# Patient Record
Sex: Male | Born: 1960 | Race: Black or African American | Hispanic: No | Smoking: Never smoker
Health system: Southern US, Community
[De-identification: ages and names within clinical notes are randomized; demographics above are authoritative.]

## PROBLEM LIST (undated history)

## (undated) DIAGNOSIS — S161XXA Strain of muscle, fascia and tendon at neck level, initial encounter: Secondary | ICD-10-CM

## (undated) DIAGNOSIS — I10 Essential (primary) hypertension: Secondary | ICD-10-CM

## (undated) DIAGNOSIS — I639 Cerebral infarction, unspecified: Secondary | ICD-10-CM

## (undated) DIAGNOSIS — S39012A Strain of muscle, fascia and tendon of lower back, initial encounter: Secondary | ICD-10-CM

---

## 1999-02-20 ENCOUNTER — Emergency Department (HOSPITAL_COMMUNITY): Admission: EM | Admit: 1999-02-20 | Discharge: 1999-02-20 | Payer: Self-pay | Admitting: Emergency Medicine

## 1999-10-03 ENCOUNTER — Emergency Department (HOSPITAL_COMMUNITY): Admission: EM | Admit: 1999-10-03 | Discharge: 1999-10-03 | Payer: Self-pay | Admitting: Emergency Medicine

## 2006-06-13 ENCOUNTER — Emergency Department (HOSPITAL_COMMUNITY): Admission: EM | Admit: 2006-06-13 | Discharge: 2006-06-13 | Payer: Self-pay | Admitting: Emergency Medicine

## 2006-08-30 ENCOUNTER — Emergency Department (HOSPITAL_COMMUNITY): Admission: EM | Admit: 2006-08-30 | Discharge: 2006-08-30 | Payer: Self-pay | Admitting: Emergency Medicine

## 2007-08-28 ENCOUNTER — Inpatient Hospital Stay (HOSPITAL_COMMUNITY): Admission: EM | Admit: 2007-08-28 | Discharge: 2007-08-31 | Payer: Self-pay | Admitting: Emergency Medicine

## 2007-08-29 ENCOUNTER — Encounter (INDEPENDENT_AMBULATORY_CARE_PROVIDER_SITE_OTHER): Payer: Self-pay | Admitting: Emergency Medicine

## 2007-09-14 ENCOUNTER — Encounter: Admission: RE | Admit: 2007-09-14 | Discharge: 2007-09-30 | Payer: Self-pay | Admitting: Neurology

## 2008-05-23 ENCOUNTER — Emergency Department (HOSPITAL_COMMUNITY): Admission: EM | Admit: 2008-05-23 | Discharge: 2008-05-24 | Payer: Self-pay | Admitting: Emergency Medicine

## 2009-04-27 IMAGING — CT CT HEAD W/O CM
1 series · 16 of 28 positions shown, 20 images · IV contrast (agent unspecified)
Comparison: none

CLINICAL DATA: Code Stroke.   Aphasia.  Now able to talk.
HEAD CT WITHOUT CONTRAST:
TECHNIQUE: Contiguous axial CT images were obtained from the base of the skull through the vertex according to standard protocol without contrast.

[Series 2: brain · axial · 0.47mm/px · z∈[+147,+272]mm · 16 of 28 slices shown, 20 images]
[im 2/28  brain]
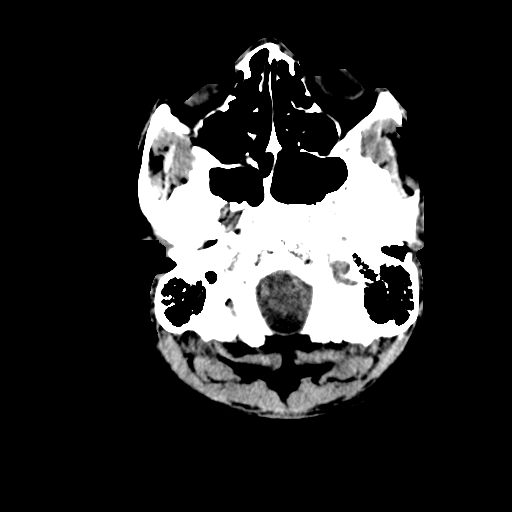
[im 2/28  bone]
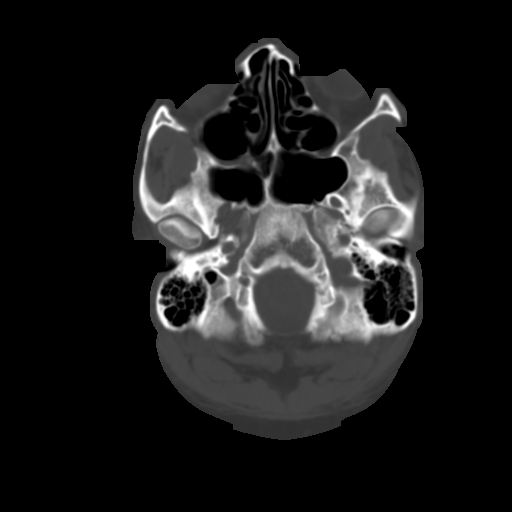
[im 4/28  brain]
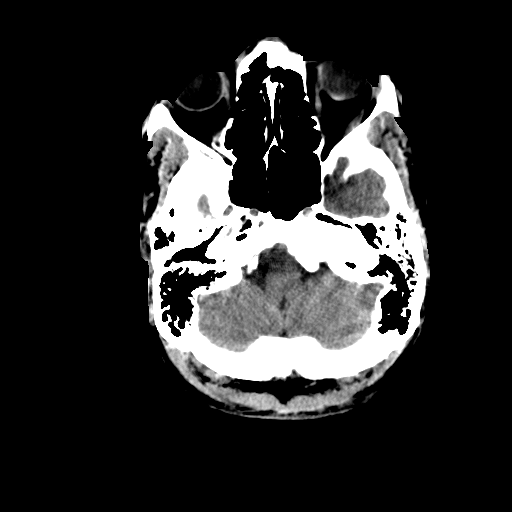
[im 6/28  brain]
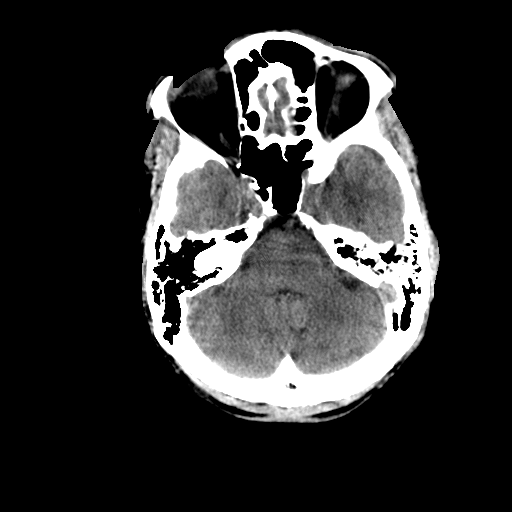
[im 7/28  brain]
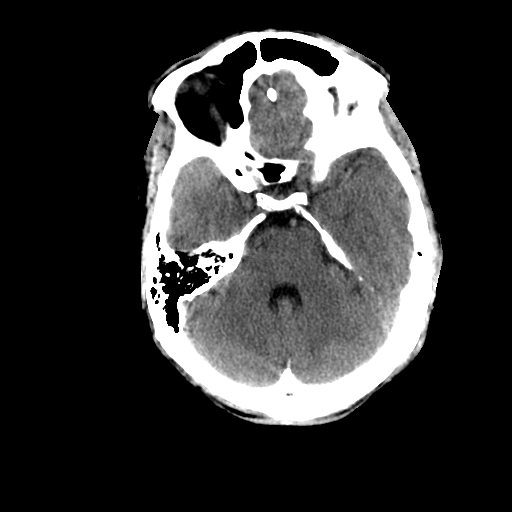
[im 9/28  brain]
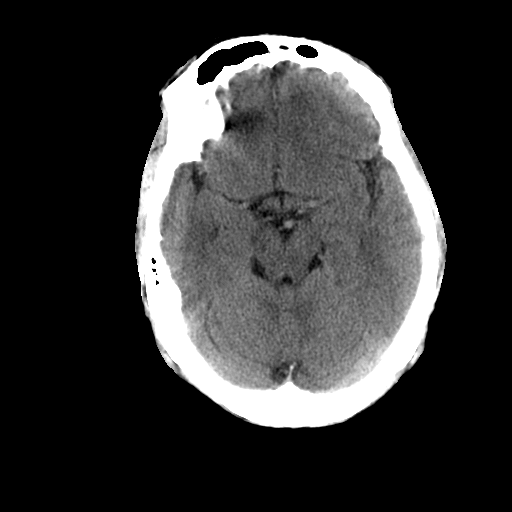
[im 9/28  bone]
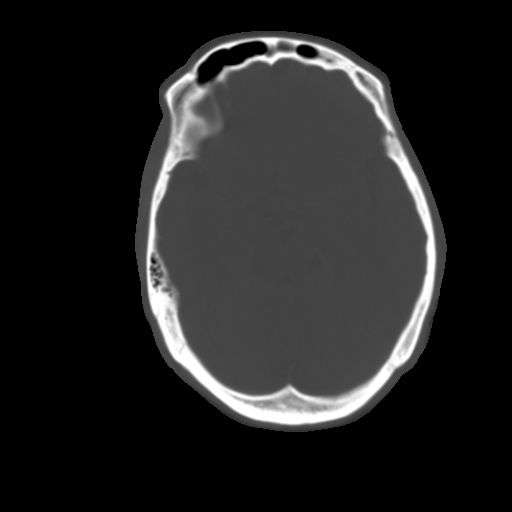
[im 10/28  brain]
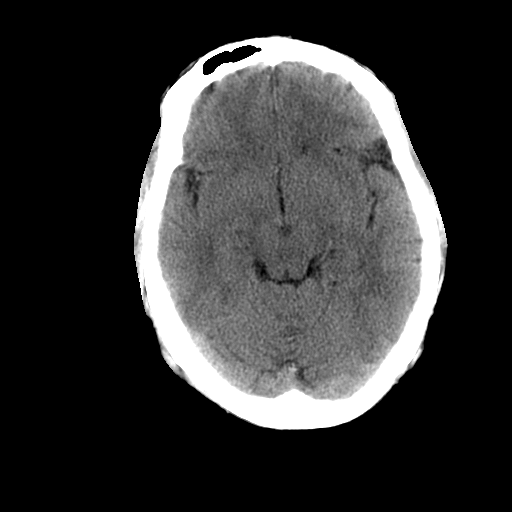
[im 12/28  brain]
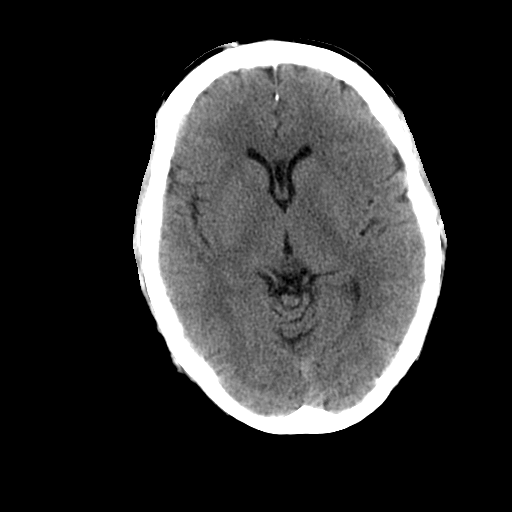
[im 14/28  brain]
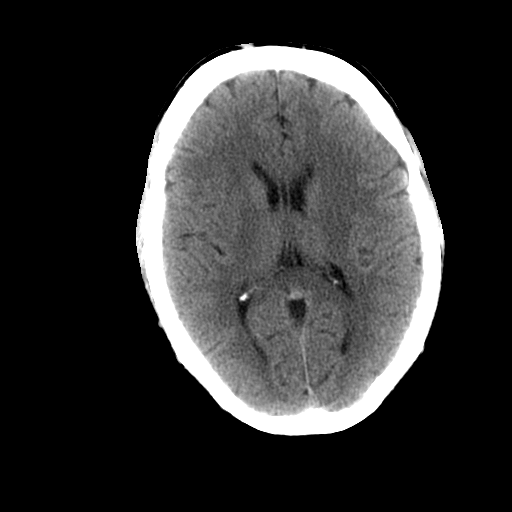
[im 15/28  brain]
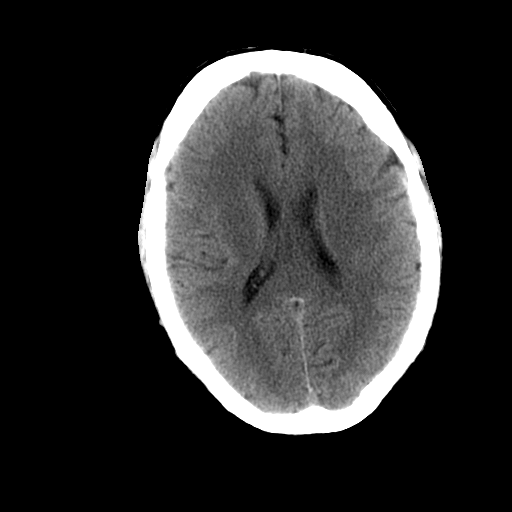
[im 15/28  bone]
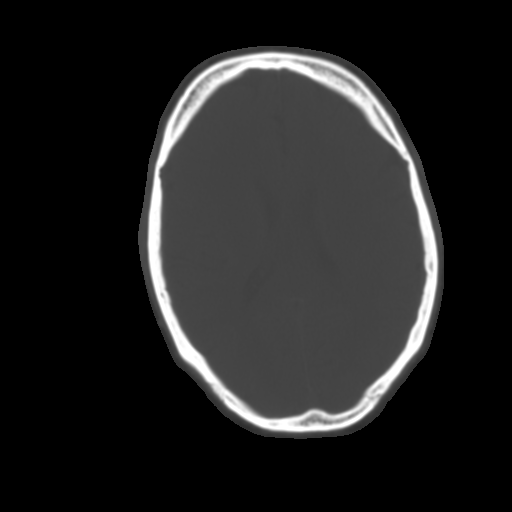
[im 17/28  brain]
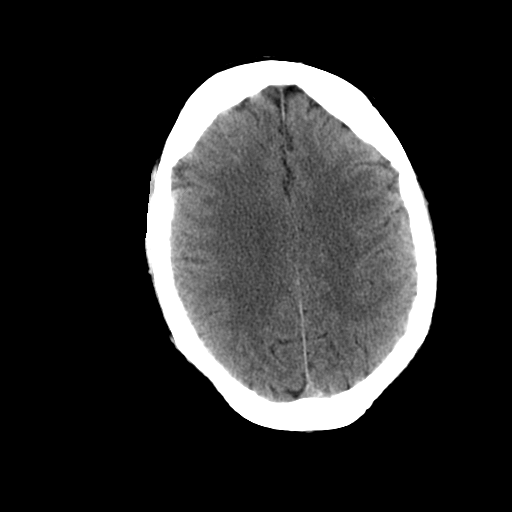
[im 19/28  brain]
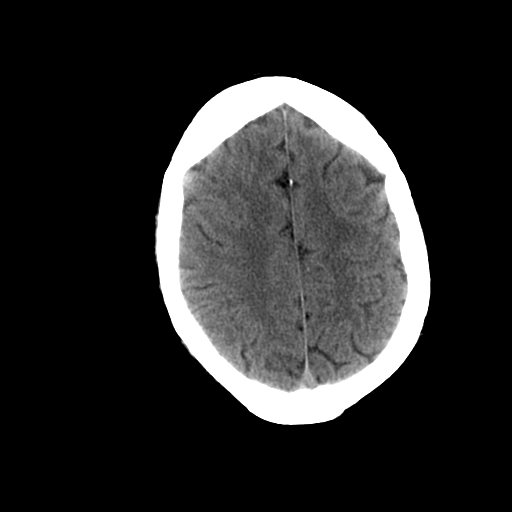
[im 20/28  brain]
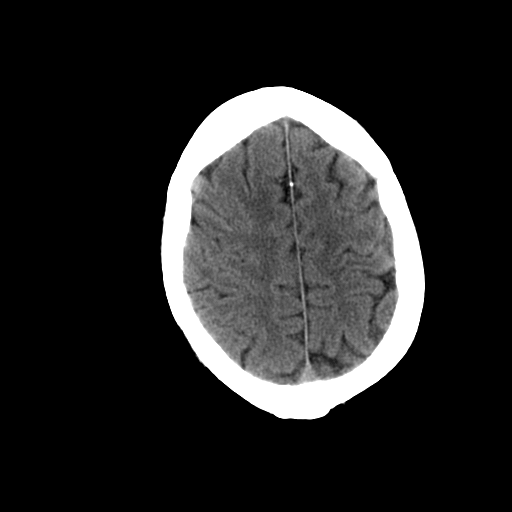
[im 22/28  brain]
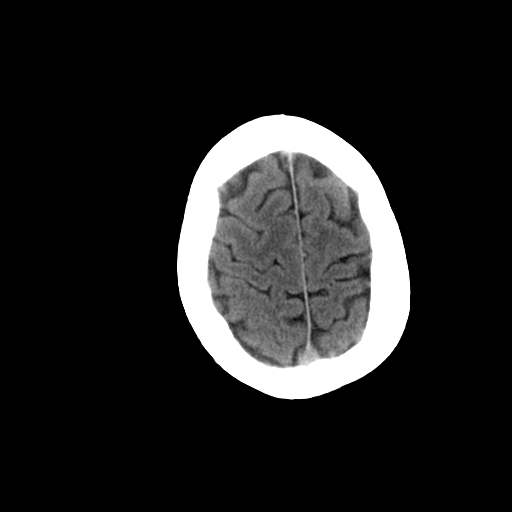
[im 22/28  bone]
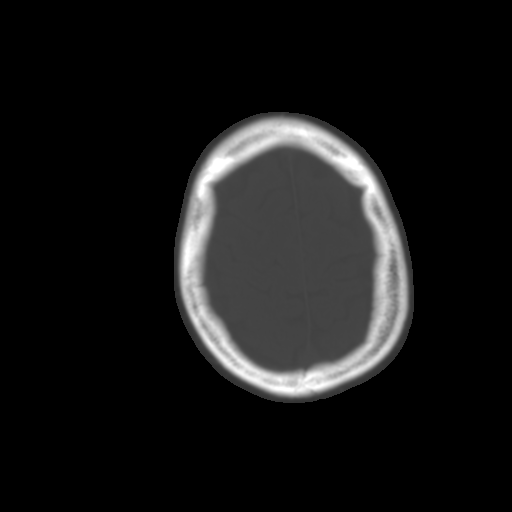
[im 23/28  brain]
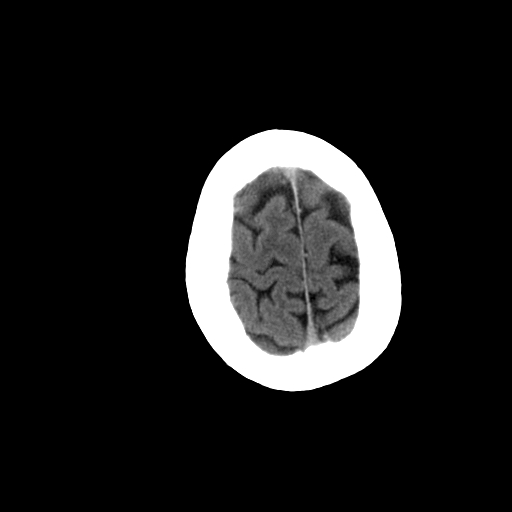
[im 25/28  brain]
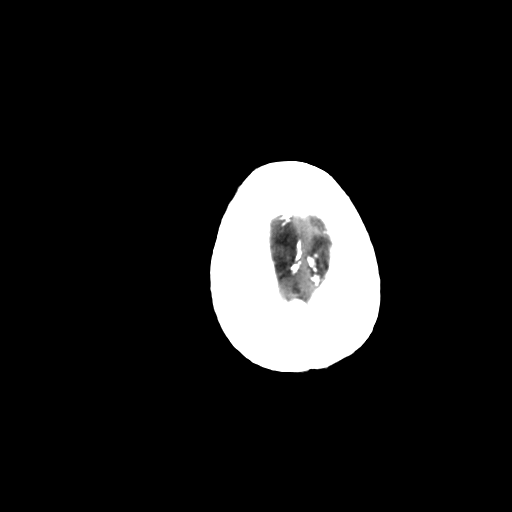
[im 27/28  brain]
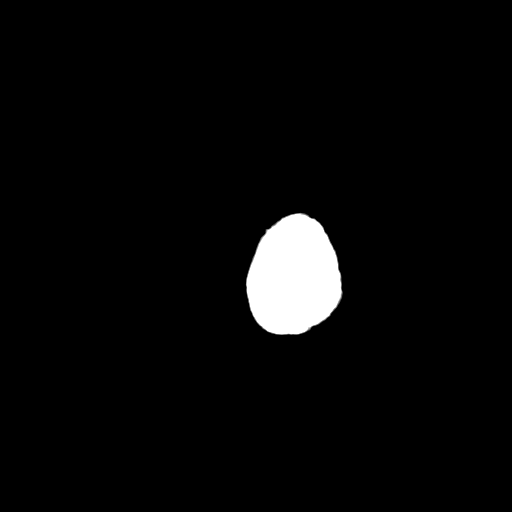

[16 of 28 positions shown; findings below may reference images not displayed]

FINDINGS: There is no evidence of intracranial hemorrhage, brain edema, acute infarct, mass lesion, or mass effect.  No other intra-axial abnormalities are seen, and the ventricles are within normal limits.  No abnormal extra-axial fluid collections or masses are identified.  No skull abnormalities are noted.
IMPRESSION: Negative non-contrast head CT.

## 2009-04-28 IMAGING — CT CT HEAD W/O CM
1 series · 16 of 28 positions shown, 20 images · non-contrast
Comparison: MR and CT dated 08/28/2007.

CLINICAL DATA: Status post TPA for a stroke 24 hours ago. Improved speech and
weakness.

HEAD CT WITHOUT CONTRAST
TECHNIQUE: 5mm collimated images were obtained from the base of the skull
through the vertex according to standard protocol without contrast.

[Series 2: head w/o · axial · non-contrast · 0.47mm/px · z∈[+105,+232]mm · 16 of 28 slices shown, 20 images]
[im 2/28  brain]
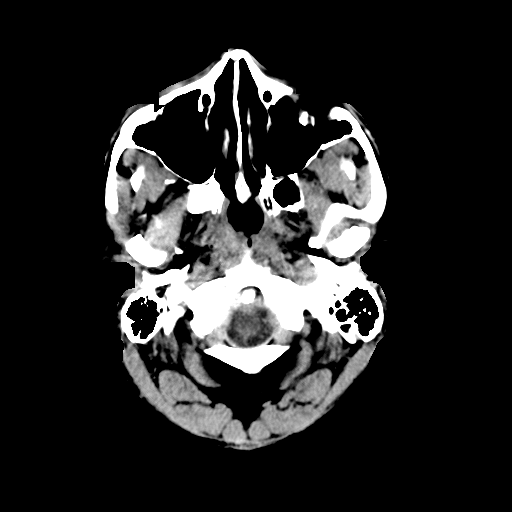
[im 2/28  bone]
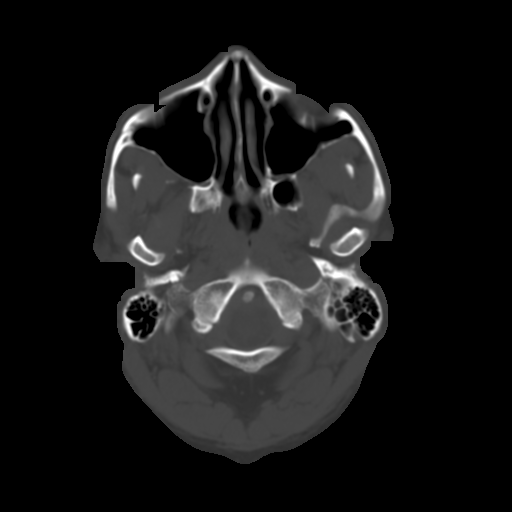
[im 4/28  brain]
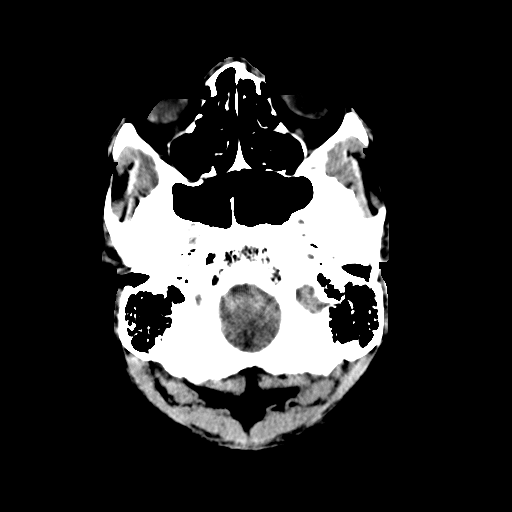
[im 6/28  brain]
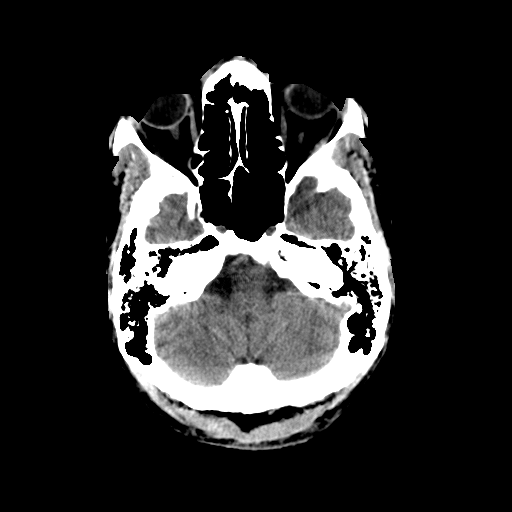
[im 7/28  brain]
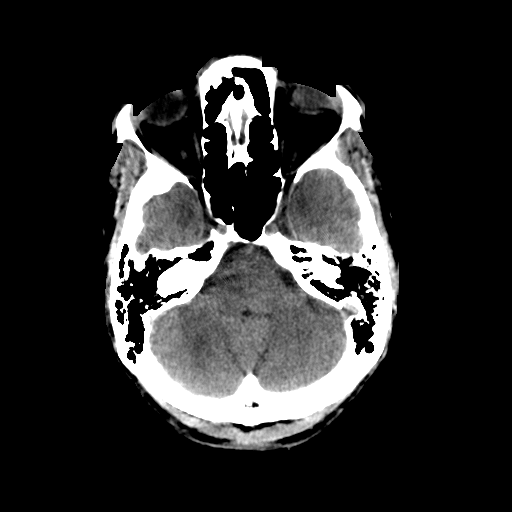
[im 9/28  brain]
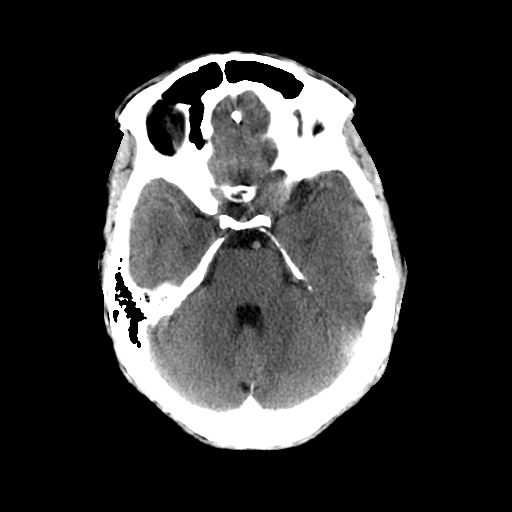
[im 9/28  bone]
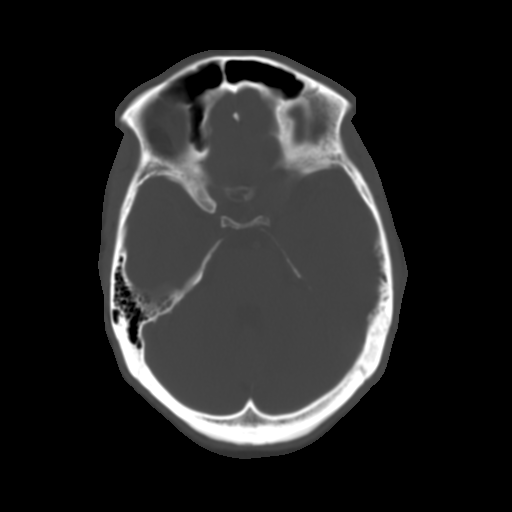
[im 10/28  brain]
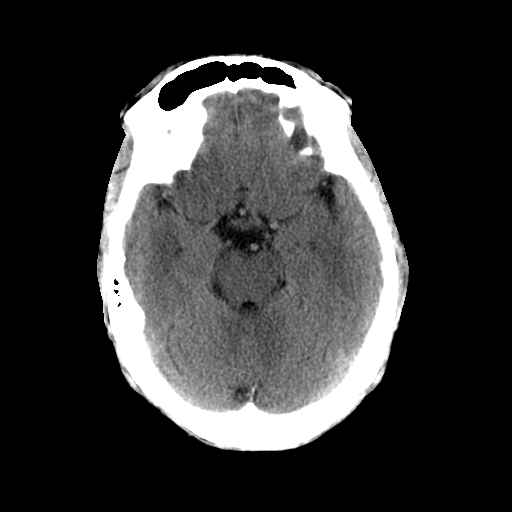
[im 12/28  brain]
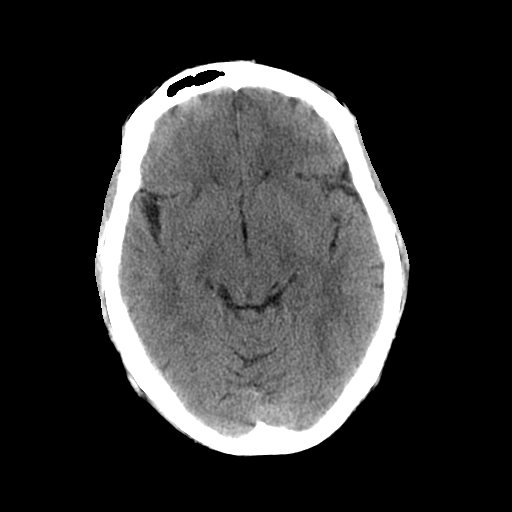
[im 14/28  brain]
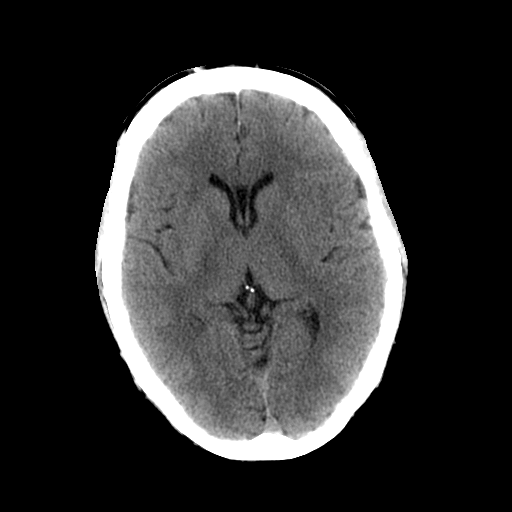
[im 15/28  brain]
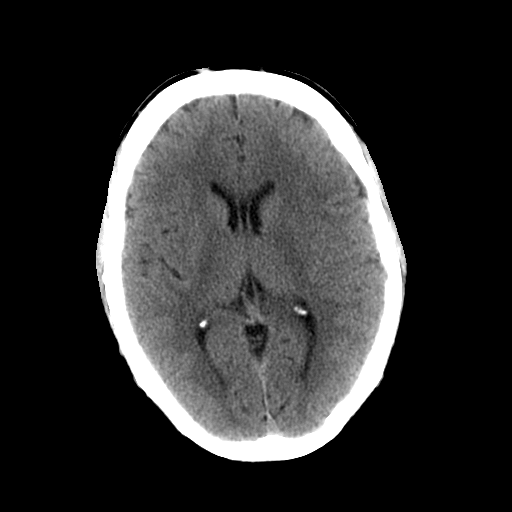
[im 15/28  bone]
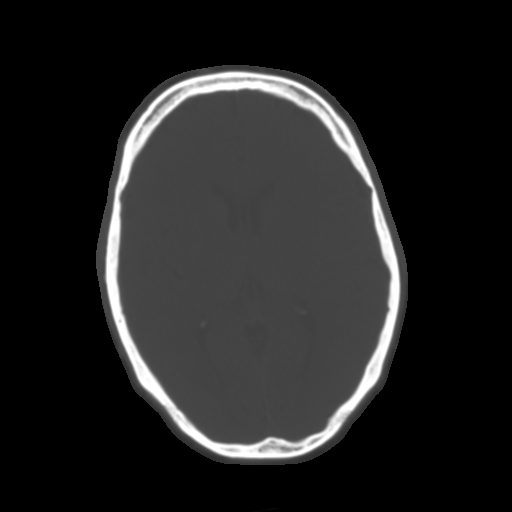
[im 17/28  brain]
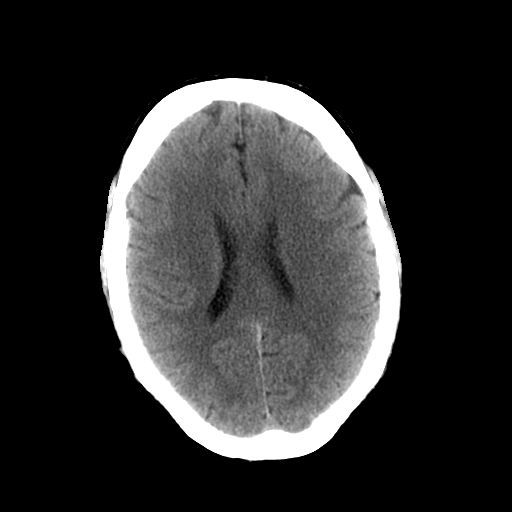
[im 19/28  brain]
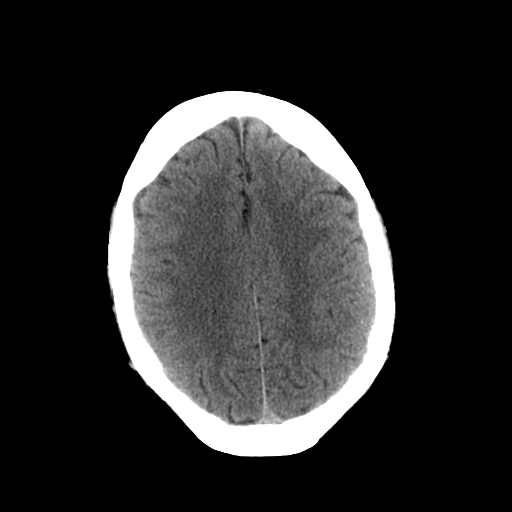
[im 20/28  brain]
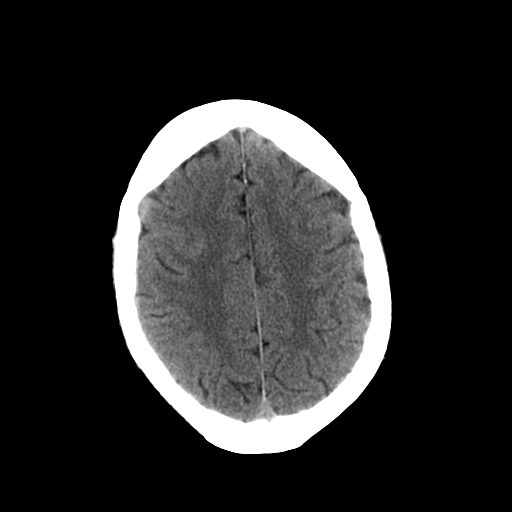
[im 22/28  brain]
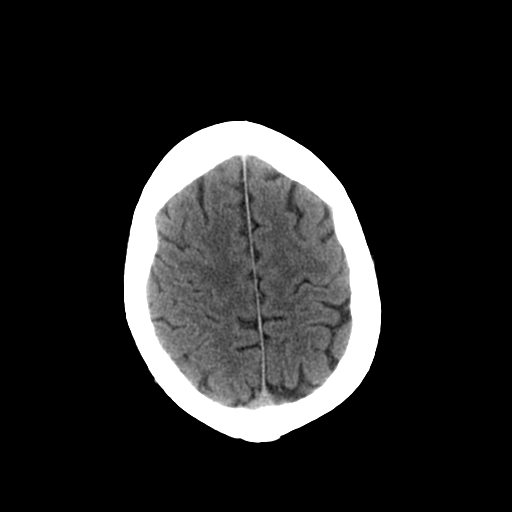
[im 22/28  bone]
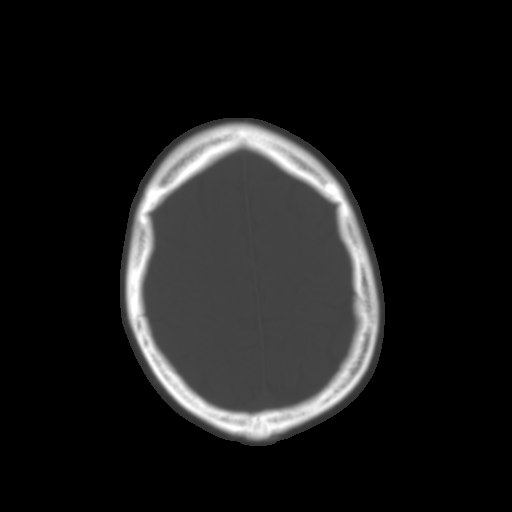
[im 23/28  brain]
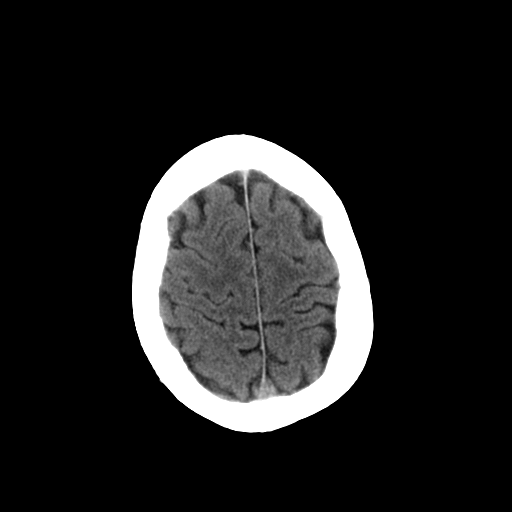
[im 25/28  brain]
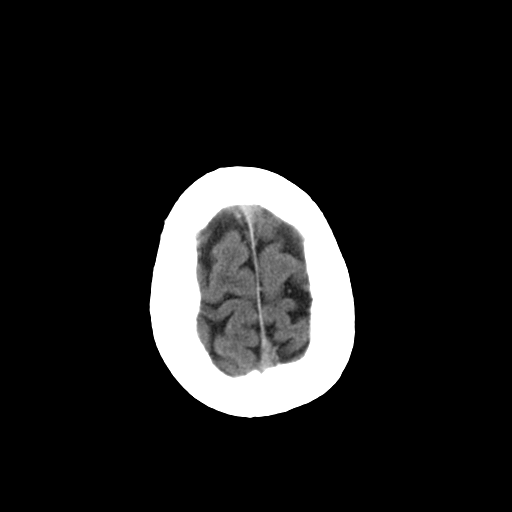
[im 27/28  brain]
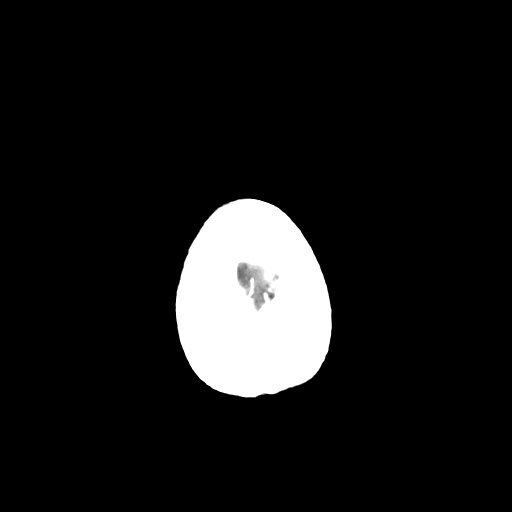

[16 of 28 positions shown; findings below may reference images not displayed]

FINDINGS: Interval mild patchy white matter low density in the left
pontomedullary junction, corresponding to the location of the recently
demonstrated area of acute infarction. This extends into the left pons and left
pontomesencephalic junction region. Stable normal sized ventricles and cavum
septum pellucidum and vergae. No intracranial hemorrhage or mass-effect.
Unremarkable bones and included portions of the paranasal sinuses.

IMPRESSION

1. No intracranial hemorrhage or mass-effect.
2. Acute/subacute infarct in the left pons and extending into the
pontomesencephalic junction and pontomedullary junction.

## 2011-02-19 NOTE — H&P (Signed)
NAME:  Justin Kelly, Justin Kelly NO.:  [id number]   MEDICAL RECORD NO.:  [id number]          PATIENT TYPE:  EMS   LOCATION:  MAJO                         FACILITY:  [facility]   PHYSICIAN:  Gustavus Messing. Orlin Hilding, M.D.DATE OF BIRTH:  1960/11/28   DATE OF ADMISSION:  08/28/2007  DATE OF DISCHARGE:                              HISTORY & PHYSICAL   NEUROLOGIC STROKE ADMISSION   Code stroke.  Time of neurology exam 1600, time neurology called 1522.  Time CT and MRI results known to neurology 1533.  Onset of symptoms  initially 12:45.  The patient cleared completely and then at 1533 again  had some mild aphasia and right facial weakness.   CHIEF COMPLAINT:  Speech not right, not feeling well, eyes burning.  The  patient had trouble communicating.   HISTORY OF PRESENT ILLNESS:  A 50 year old right-handed black man with  no significant past medical history although he does not go see the  doctor on a regular basis.  He was at work today and had onset of his  speech not being right and some shortness of breath and some strange  sensations in his eyes although not loss of vision.  He drove himself to  the emergency room and sat in triage for about an hour and half before  his symptoms were recognized.   PAST MEDICAL HISTORY:  He tells me only that he has been to the [place] in  the past for a bladder infection.  Denies known hypertension, diabetes  or hyperlipidemia but does not go to the doctor.   MEDICATIONS:  None routinely.   ALLERGIES:  None.   FAMILY HISTORY:  Positive for hypertension and stroke.   SOCIAL HISTORY:  He is former Hotel manager.  No tobacco, alcohol, or  recreational drug use currently.   REVIEW OF SYSTEMS:  Except for the language, a 13-system review is  negative except for his eyes burning and some mild shortness of breath.   OBJECTIVE:  VITAL SIGNS:  On exam, temperature is 98.5 pulse 87, blood  pressure 141/92, respirations 20, and 98% sat on room air.  Blood  pressure is 191/92  HEENT:  Head is normocephalic, atraumatic.  NECK:  Supple without bruits.  HEART:  Regular rate and rhythm.  LUNGS:  Clear to auscultation.  ABDOMEN:  Benign.  Nutritional status adequate.  SKIN:  Dry.  Mucosa moist.  NEUROLOGIC:  He is awake and alert.  He answers 2 questions correctly.  Follows 2 commands correctly.  Extraocular movements are intact.  Visual  fields are full.  Facial:  He does have a slight facial droop on the  right.  There is no drift in either the upper or lower extremities.  He  has no definite sensory changes.  Finger-to-nose and heel-to-shin are  normal.  There is no ataxia.  He has a mild aphasia.  No dysarthria, no  extinction, so scores a 2 on the stroke scale.  Modified Rankin was 0.   Test results are pending at this time.  CT of the head was essentially  normal.   ASSESSMENT:  Fluctuating mild  aphasia and right facial droop, possible  hypertensive small vessel stroke.   PLAN:  We will get an MRI and if it does show a stroke we will give t-PA  as his symptoms have been fluctuating.  We are using the  time after he  recovered from the first  spell to back to normal, at 1533, as his last  seen normal onset of symptoms.      Catherine A. Orlin Hilding, M.D.  Electronically Signed     CAW/MEDQ  D:  08/28/2007  T:  08/29/2007  Job:  [id number]

## 2011-02-19 NOTE — Discharge Summary (Signed)
NAME:  Justin Kelly, Justin Kelly               ACCOUNT NO.:  [id number]   MEDICAL RECORD NO.:  [id number]          PATIENT TYPE:  INP   LOCATION:  3038                         FACILITY:  [facility]   PHYSICIAN:  Pramod P. Pearlean Brownie, MD    DATE OF BIRTH:  12/31/1960   DATE OF ADMISSION:  08/28/2007  DATE OF DISCHARGE:  08/31/2007                               DISCHARGE SUMMARY   DISCHARGE DIAGNOSES:  1. 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  2. Status post full-dose IV t-PA.  3. Hypertension.  4. Dyslipidemia.   DISCHARGE MEDICATIONS:  1. Zocor 40 mg a day.  2. Hydrochlorothiazide 12.5 mg a day.  3. Aspirin 325 mg a day.   STUDIES PERFORMED.:  1. CT of the brain on admission showed no acute abnormalities.  2. MRI of the brain shows acute stroke at the pontomedullary junction      just left of the midline.  3. MRA of the brain was negative for large or medium-size vessels.  4. CT of the brain after t-PA shows no intracranial hemorrhage or mass      effect.  A subacute-acute infarct in the left pons and extending      into the pontomedullary cephalic junction and pontomedullary      junction.  5. A 2-D echocardiogram performed, results pending.  6. Carotid Dopplers performed.  No ICA stenosis.  7. Transcranial Doppler not performed.  8. EKG shows undetermined rhythm, nonspecific ST and T wave      abnormality.  9. Laboratory studies:  Homocystine 7.8.  Cholesterol 240,      triglycerides 76, HDL 57, LDL 168. Hemoglobin A1c 6.5. Troponin I      0.02, CK-MB with CK 338, otherwise normal.  Urinalysis normal. CBC      normal. Chemistry with glucose 116, otherwise normal.  Liver      function tests normal   HISTORY OF PRESENT ILLNESS:  Mr. Justin Kelly is a 50 year old right-  handed African-American male with a significant past medical history who  was at work the day of admission and had onset of speech not been right,  shortness of breath, and some strange sensations in his  eyes, although  not loss of vision.  He drove himself to the emergency room and sat in  triage for about an hour and one-half before symptoms were recognized.  At that time, a Code Stroke was called.  CT of the brain showed no acute  abnormality.  His symptoms waxed and waned with the symptoms completely  resolving at 1533, then had repeat recurrence.  A full dose of IV t-PA  was given, and he was admitted to the ICU.   HOSPITAL COURSE:  MRI did reveal an acute pontomedullary infarct just  left of the midline.  The patient had some right ataxia and right  hemiparesis in arm and leg with initial physical therapy review  recommending inpatient rehab.  Over the weekend, his symptoms continued  to clear, and by Monday he was able to walk 100 feet and only had some  mild weakness in his upper  extremity and some mild ataxia.  He was  placed on aspirin for secondary stroke prevention.  He was found to have  vascular risk factors with elevated cholesterol with LDL 168 and was  started on Zocor 40.  He was also found to have some mild hypertension  in the 130s/80s and was started on low-dose hydrochlorothiazide.  Hypercoagulable panel was drawn, and results are pending at time of  discharge.  Will bring back to the outpatient arena for transcranial  Doppler and emboli monitoring due to his young age.  The patient does  not have a primary care physician, and it has been recommended that he  find one to see within the next 1 month for risk factor control.   CONDITION ON DISCHARGE:  The patient alert and oriented x3.  No aphasia.  No dysarthria.  Eye movements are full.  Minimal right face asymmetry.  No drift, decreased fine motor movement on the right. Orbits is right  over the left and has good lower extremity strength.  He has subjective  numbness in his right hand.   DISCHARGE PLAN:  1. Discharge home with family.  2. Aspirin for secondary stroke prevention.  3. New statin  needs followup  in 4-6 weeks.  4. Outpatient PT and OT.  5. Get a primary care physician.  Has been referred to [facility].      Recommend him be seen there within 1 month.  6. Follow up with Dr. Delia Heady in the office for outpatient      transcranial Doppler bubble study and emboli monitoring within the      next month.      Annie Main, N.P.    ______________________________  Sunny Schlein. Pearlean Brownie, MD    SB/MEDQ  D:  08/31/2007  T:  08/31/2007  Job:  [id number]   cc:   Outpatient Rehab on [address]  Fleet Contras, M.D.

## 2011-07-16 LAB — PROTEIN S ACTIVITY: Protein S Activity: 106 % (ref 69–129)

## 2011-07-16 LAB — PROTEIN S, TOTAL: Protein S Ag, Total: 160 % — ABNORMAL HIGH (ref 70–140)

## 2011-07-16 LAB — LIPID PANEL
Total CHOL/HDL Ratio: 4.2
Triglycerides: 76

## 2011-07-16 LAB — HEMOGLOBIN A1C
Hgb A1c MFr Bld: 6.1
Mean Plasma Glucose: 140

## 2011-07-16 LAB — COMPREHENSIVE METABOLIC PANEL
AST: 28
Chloride: 105
Creatinine, Ser: 1.13
GFR calc Af Amer: >60
Glucose, Bld: 116 — ABNORMAL HIGH
Total Bilirubin: 0.8
Total Protein: 7.7

## 2011-07-16 LAB — URINALYSIS, ROUTINE W REFLEX MICROSCOPIC
Glucose, UA: NEGATIVE
Hgb urine dipstick: NEGATIVE
Protein, ur: NEGATIVE
pH: 5.5

## 2011-07-16 LAB — CBC
Hemoglobin: 14.1
MCHC: 33.4
MCV: 82.3
RDW: 15.4

## 2011-07-16 LAB — BETA-2-GLYCOPROTEIN I ABS, IGG/M/A: Beta-2 Glyco I IgG: <4 U/mL (ref <20)

## 2011-07-16 LAB — DIFFERENTIAL
Basophils Absolute: 0
Eosinophils Relative: 0
Neutro Abs: 4.9

## 2011-07-16 LAB — LUPUS ANTICOAGULANT PANEL: Lupus Anticoagulant: NOT DETECTED

## 2011-07-16 LAB — COMPLEMENT, TOTAL: Compl, Total (CH50): 62 U/mL (ref 31–66)

## 2011-07-16 LAB — SEDIMENTATION RATE: Sed Rate: 7

## 2011-07-16 LAB — HOMOCYSTEINE: Homocysteine: 7.8

## 2011-07-16 LAB — CK TOTAL AND CKMB (NOT AT ARMC)
CK, MB: 3.9
Relative Index: 1.2

## 2011-07-16 LAB — PROTIME-INR
INR: 0.9
Prothrombin Time: 12.2

## 2011-07-16 LAB — FACTOR 5 LEIDEN

## 2011-07-16 LAB — APTT: aPTT: 27

## 2011-07-16 LAB — CARDIOLIPIN ANTIBODIES, IGG, IGM, IGA: Anticardiolipin IgM: <7 — ABNORMAL LOW (ref <10)

## 2012-02-09 ENCOUNTER — Encounter (HOSPITAL_COMMUNITY): Payer: Self-pay | Admitting: *Deleted

## 2012-02-09 ENCOUNTER — Emergency Department (INDEPENDENT_AMBULATORY_CARE_PROVIDER_SITE_OTHER)
Admission: EM | Admit: 2012-02-09 | Discharge: 2012-02-09 | Disposition: A | Discharge status: Home / Self Care | Payer: Managed Care, Other (non HMO) | Source: Home / Self Care | Attending: Emergency Medicine | Admitting: Emergency Medicine

## 2012-02-09 DIAGNOSIS — S39012A Strain of muscle, fascia and tendon of lower back, initial encounter: Secondary | ICD-10-CM

## 2012-02-09 DIAGNOSIS — S339XXA Sprain of unspecified parts of lumbar spine and pelvis, initial encounter: Secondary | ICD-10-CM

## 2012-02-09 HISTORY — DX: Strain of muscle, fascia and tendon of lower back, initial encounter: S39.012A

## 2012-02-09 HISTORY — DX: Essential (primary) hypertension: I10

## 2012-02-09 HISTORY — DX: Strain of muscle, fascia and tendon at neck level, initial encounter: S16.1XXA

## 2012-02-09 HISTORY — DX: Cerebral infarction, unspecified: I63.9

## 2012-02-09 MED ORDER — METAXALONE 800 MG PO TABS: 800.0000 mg | ORAL_TABLET | Freq: Three times a day (TID) | ORAL | Status: AC

## 2012-02-09 MED ORDER — PREDNISONE 20 MG PO TABS: ORAL_TABLET | ORAL | Status: AC

## 2012-02-09 MED ORDER — MELOXICAM 15 MG PO TABS: 15.0000 mg | ORAL_TABLET | Freq: Every day | ORAL | Status: AC

## 2012-02-09 MED ORDER — HYDROCODONE-ACETAMINOPHEN 5-325 MG PO TABS: 2.0000 | ORAL_TABLET | ORAL | Status: AC | PRN

## 2012-02-09 NOTE — Discharge Instructions (Signed)
Take the medication as written. Take 1 gram of tylenol up to 4 times a day as needed for pain.. This with the meloxicam is an effective combination for pain. Take the hydrocodone/norco only for severe pain. Do not take the tylenol and hydrocodone/norco as they both have tylenol in them and too much can hurt your liver. Return if you get worse, have a  fever >100.4, or for any concerns.   Go to www.goodrx.com to look up your medications. This will give you a list of where you can find your prescriptions at the most affordable prices.

## 2012-02-09 NOTE — ED Notes (Signed)
Pt with c/o low back pain left lower back radiating down left leg - pt lifted lawnmower out of trunk yesterday felt a pull in lower back - unable to sleep last night - aching pain down left leg
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
## 2014-09-13 ENCOUNTER — Encounter (HOSPITAL_COMMUNITY): Payer: Self-pay

## 2014-09-13 ENCOUNTER — Emergency Department (HOSPITAL_COMMUNITY)
Admission: EM | Admit: 2014-09-13 | Discharge: 2014-09-13 | Disposition: A | Discharge status: Home / Self Care | Payer: Managed Care, Other (non HMO) | Attending: Emergency Medicine | Admitting: Emergency Medicine

## 2014-09-13 DIAGNOSIS — Z7982 Long term (current) use of aspirin: Secondary | ICD-10-CM | POA: Diagnosis not present

## 2014-09-13 DIAGNOSIS — Y998 Other external cause status: Secondary | ICD-10-CM | POA: Diagnosis not present

## 2014-09-13 DIAGNOSIS — Y9389 Activity, other specified: Secondary | ICD-10-CM | POA: Insufficient documentation

## 2014-09-13 DIAGNOSIS — Z8673 Personal history of transient ischemic attack (TIA), and cerebral infarction without residual deficits: Secondary | ICD-10-CM | POA: Insufficient documentation

## 2014-09-13 DIAGNOSIS — S59901A Unspecified injury of right elbow, initial encounter: Secondary | ICD-10-CM | POA: Diagnosis present

## 2014-09-13 DIAGNOSIS — M7031 Other bursitis of elbow, right elbow: Secondary | ICD-10-CM | POA: Insufficient documentation

## 2014-09-13 DIAGNOSIS — Z79899 Other long term (current) drug therapy: Secondary | ICD-10-CM | POA: Diagnosis not present

## 2014-09-13 DIAGNOSIS — X58XXXA Exposure to other specified factors, initial encounter: Secondary | ICD-10-CM | POA: Diagnosis not present

## 2014-09-13 DIAGNOSIS — I10 Essential (primary) hypertension: Secondary | ICD-10-CM | POA: Diagnosis not present

## 2014-09-13 DIAGNOSIS — M7021 Olecranon bursitis, right elbow: Secondary | ICD-10-CM

## 2014-09-13 DIAGNOSIS — Y9289 Other specified places as the place of occurrence of the external cause: Secondary | ICD-10-CM | POA: Diagnosis not present

## 2014-09-13 MED ORDER — NAPROXEN 500 MG PO TABS: 500.0000 mg | ORAL_TABLET | Freq: Two times a day (BID) | ORAL | Status: AC

## 2014-09-13 NOTE — ED Notes (Signed)
Pt presents with c/o right elbow injury. Pt reports he fell onto his elbow approx a month and a half ago and about 3 weeks ago he started noticing some swelling to his elbow. Pt has significant swelling to that right elbow at this time. ROM intact.

## 2014-09-13 NOTE — Discharge Instructions (Signed)
Bursitis °Bursitis is a swelling and soreness (inflammation) of a fluid-filled sac (bursa) that overlies and protects a joint. It can be caused by injury, overuse of the joint, arthritis or infection. The joints most likely to be affected are the elbows, shoulders, hips and knees. °HOME CARE INSTRUCTIONS  °· Apply ice to the affected area for 15-20 minutes each hour while awake for 2 days. Put the ice in a plastic bag and place a towel between the bag of ice and your skin. °· Rest the injured joint as much as possible, but continue to put the joint through a full range of motion, 4 times per day. (The shoulder joint especially becomes rapidly "frozen" if not used.) When the pain lessens, begin normal slow movements and usual activities. °· Only take over-the-counter or prescription medicines for pain, discomfort or fever as directed by your caregiver. °· Your caregiver may recommend draining the bursa and injecting medicine into the bursa. This may help the healing process. °· Follow all instructions for follow-up with your caregiver. This includes any orthopedic referrals, physical therapy and rehabilitation. Any delay in obtaining necessary care could result in a delay or failure of the bursitis to heal and chronic pain. °SEEK IMMEDIATE MEDICAL CARE IF:  °· Your pain increases even during treatment. °· You develop an oral temperature above 102° F (38.9° C) and have heat and inflammation over the involved bursa. °MAKE SURE YOU:  °· Understand these instructions. °· Will watch your condition. °· Will get help right away if you are not doing well or get worse. °Document Released: 09/20/2000 Document Revised: 12/16/2011 Document Reviewed: 12/13/2013 °[employer]® Patient Information ©2015 [employer]. This information is not intended to replace advice given to you by your health care provider. Make sure you discuss any questions you have with your health care provider. ° °
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
## 2019-05-26 ENCOUNTER — Emergency Department (HOSPITAL_COMMUNITY)
Admission: EM | Admit: 2019-05-26 | Discharge: 2019-05-27 | Disposition: A | Discharge status: Home / Self Care | Payer: BC Managed Care – PPO | Attending: Emergency Medicine | Admitting: Emergency Medicine

## 2019-05-26 DIAGNOSIS — K644 Residual hemorrhoidal skin tags: Secondary | ICD-10-CM | POA: Diagnosis not present

## 2019-05-26 DIAGNOSIS — I10 Essential (primary) hypertension: Secondary | ICD-10-CM | POA: Insufficient documentation

## 2019-05-26 DIAGNOSIS — Z79899 Other long term (current) drug therapy: Secondary | ICD-10-CM | POA: Insufficient documentation

## 2019-05-26 DIAGNOSIS — N400 Enlarged prostate without lower urinary tract symptoms: Secondary | ICD-10-CM | POA: Diagnosis not present

## 2019-05-26 DIAGNOSIS — Z7982 Long term (current) use of aspirin: Secondary | ICD-10-CM | POA: Diagnosis not present

## 2019-05-26 DIAGNOSIS — R350 Frequency of micturition: Secondary | ICD-10-CM | POA: Diagnosis present

## 2019-05-27 ENCOUNTER — Encounter (HOSPITAL_COMMUNITY): Payer: Self-pay | Admitting: Emergency Medicine

## 2019-05-27 ENCOUNTER — Other Ambulatory Visit: Payer: Self-pay

## 2019-05-27 LAB — URINALYSIS, ROUTINE W REFLEX MICROSCOPIC
Bilirubin Urine: NEGATIVE
Glucose, UA: NEGATIVE mg/dL
Hgb urine dipstick: NEGATIVE
Ketones, ur: NEGATIVE mg/dL
Leukocytes,Ua: NEGATIVE
Nitrite: NEGATIVE
Protein, ur: NEGATIVE mg/dL
Specific Gravity, Urine: 1.015 (ref 1.005–1.030)
pH: 5 (ref 5.0–8.0)

## 2019-05-27 MED ORDER — TAMSULOSIN HCL 0.4 MG PO CAPS: 0.4000 mg | ORAL_CAPSULE | Freq: Every day | ORAL | 0 refills | Status: AC

## 2019-05-27 MED ORDER — HYDROCORTISONE (PERIANAL) 2.5 % EX CREA: 1.0000 "application " | TOPICAL_CREAM | Freq: Two times a day (BID) | CUTANEOUS | 0 refills | Status: AC

## 2019-05-27 NOTE — ED Triage Notes (Signed)
Pt reports feeling the urge to urinate but is unable. Denies pain with urination. Also c/o bleeding to his hemorrhoids.

## 2019-05-27 NOTE — Discharge Instructions (Signed)
You have been prescribed Flomax for symptom management.  Follow-up with your primary care doctor within 1 week to have your symptoms reassessed and to see if you should continue this medication.  If your frequency persists, you may benefit from follow-up with a urologist/specialist.  Continue your other daily medications.  You may return for any new or concerning symptoms.
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
